# Patient Record
Sex: Female | Born: 2005
Health system: Southern US, Community
[De-identification: ages and names within clinical notes are randomized; demographics above are authoritative.]

---
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
## 2023-02-08 ENCOUNTER — Other Ambulatory Visit: Payer: Self-pay

## 2023-02-08 ENCOUNTER — Encounter: Payer: Self-pay | Admitting: Emergency Medicine

## 2023-02-08 ENCOUNTER — Emergency Department
Admission: EM | Admit: 2023-02-08 | Discharge: 2023-02-08 | Disposition: A | Discharge status: Home / Self Care | Payer: MEDICAID | Attending: Emergency Medicine | Admitting: Emergency Medicine

## 2023-02-08 ENCOUNTER — Emergency Department: Payer: MEDICAID

## 2023-02-08 DIAGNOSIS — F419 Anxiety disorder, unspecified: Secondary | ICD-10-CM | POA: Insufficient documentation

## 2023-02-08 DIAGNOSIS — E860 Dehydration: Secondary | ICD-10-CM | POA: Diagnosis not present

## 2023-02-08 DIAGNOSIS — R0602 Shortness of breath: Secondary | ICD-10-CM | POA: Insufficient documentation

## 2023-02-08 DIAGNOSIS — R0789 Other chest pain: Secondary | ICD-10-CM | POA: Diagnosis not present

## 2023-02-08 DIAGNOSIS — E876 Hypokalemia: Secondary | ICD-10-CM | POA: Diagnosis not present

## 2023-02-08 DIAGNOSIS — T43215A Adverse effect of selective serotonin and norepinephrine reuptake inhibitors, initial encounter: Secondary | ICD-10-CM | POA: Insufficient documentation

## 2023-02-08 DIAGNOSIS — R531 Weakness: Secondary | ICD-10-CM | POA: Insufficient documentation

## 2023-02-08 DIAGNOSIS — T50905A Adverse effect of unspecified drugs, medicaments and biological substances, initial encounter: Secondary | ICD-10-CM

## 2023-02-08 LAB — COMPREHENSIVE METABOLIC PANEL
ALT: 14 U/L (ref 0–44)
AST: 17 U/L (ref 15–41)
Albumin: 4.9 g/dL (ref 3.5–5.0)
Alkaline Phosphatase: 52 U/L (ref 38–126)
Anion gap: 12 (ref 5–15)
BUN: 8 mg/dL (ref 6–20)
CO2: 21 mmol/L — ABNORMAL LOW (ref 22–32)
Calcium: 9.8 mg/dL (ref 8.9–10.3)
Chloride: 102 mmol/L (ref 98–111)
Creatinine, Ser: 0.65 mg/dL (ref 0.44–1.00)
GFR, Estimated: >60 mL/min (ref >60)
Glucose, Bld: 103 mg/dL — ABNORMAL HIGH (ref 70–99)
Potassium: 3 mmol/L — ABNORMAL LOW (ref 3.5–5.1)
Sodium: 135 mmol/L (ref 135–145)
Total Bilirubin: 1.5 mg/dL — ABNORMAL HIGH (ref <1.2)
Total Protein: 7.7 g/dL (ref 6.5–8.1)

## 2023-02-08 LAB — URINALYSIS, ROUTINE W REFLEX MICROSCOPIC
Bilirubin Urine: NEGATIVE
Glucose, UA: NEGATIVE mg/dL
Hgb urine dipstick: NEGATIVE
Ketones, ur: 20 mg/dL — AB
Leukocytes,Ua: NEGATIVE
Nitrite: NEGATIVE
Protein, ur: NEGATIVE mg/dL
Specific Gravity, Urine: 1.002 — ABNORMAL LOW (ref 1.005–1.030)
pH: 7 (ref 5.0–8.0)

## 2023-02-08 LAB — TROPONIN I (HIGH SENSITIVITY): Troponin I (High Sensitivity): 2 ng/L (ref <18)

## 2023-02-08 LAB — LIPASE, BLOOD: Lipase: 29 U/L (ref 11–51)

## 2023-02-08 LAB — PREGNANCY, URINE: Preg Test, Ur: NEGATIVE

## 2023-02-08 MED ORDER — KETOROLAC TROMETHAMINE 30 MG/ML IJ SOLN
15.0000 mg | Freq: Once | INTRAMUSCULAR | Status: AC
  Administered 2023-02-08: 15 mg via INTRAVENOUS
  Filled 2023-02-08: qty 1

## 2023-02-08 MED ORDER — POTASSIUM CHLORIDE CRYS ER 20 MEQ PO TBCR
40.0000 meq | EXTENDED_RELEASE_TABLET | Freq: Once | ORAL | Status: AC
  Administered 2023-02-08: 40 meq via ORAL
  Filled 2023-02-08: qty 2

## 2023-02-08 MED ORDER — LORAZEPAM 2 MG/ML IJ SOLN
1.0000 mg | Freq: Once | INTRAMUSCULAR | Status: AC
  Administered 2023-02-08: 1 mg via INTRAVENOUS
  Filled 2023-02-08: qty 1

## 2023-02-08 MED ORDER — LORAZEPAM 2 MG/ML IJ SOLN: 0.5000 mg | Freq: Once | INTRAMUSCULAR | Status: DC

## 2023-02-08 MED ORDER — ONDANSETRON HCL 4 MG/2ML IJ SOLN
4.0000 mg | Freq: Once | INTRAMUSCULAR | Status: AC
  Administered 2023-02-08: 4 mg via INTRAVENOUS
  Filled 2023-02-08: qty 2

## 2023-02-08 MED ORDER — SODIUM CHLORIDE 0.9 % IV BOLUS
500.0000 mL | Freq: Once | INTRAVENOUS | Status: AC
  Administered 2023-02-08: 500 mL via INTRAVENOUS

## 2023-02-08 NOTE — ED Triage Notes (Signed)
Patient ambulatory to triage with steady gait, without difficulty or distress noted; pt reports dizziness, weakness and SHOB since last night; st has had issues with nausea and subsequent decreased PO intake so unsure if that contributed to her symptoms or if it was due to restarting duloxetine; also reports having migraine

## 2023-02-08 NOTE — Discharge Instructions (Signed)
Restart Duloxetine at 1/2 dose and titrate up over the next 3 to 5 days.  Drink plenty of fluids daily.  Return to the ER for worsening symptoms, persistent vomiting, difficulty breathing or other concerns.

## 2023-02-08 NOTE — ED Provider Notes (Signed)
[facility] Provider Note    Event Date/Time   First MD Initiated Contact with Patient 02/08/23 [phone]     (approximate)   History   Shortness of Breath   HPI  Erica Nelson is a 17 y.o. female who presents to the ED from home with multiple medical complaints.  Patient restarted duloxetine yesterday.  She had been placed on it in February but taking herself off because she was able to stabilize her mood swings.  She has been experiencing labile mood recently so restarted it yesterday.  Since restarting duloxetine, patient reports dizziness/lightheadedness, generalized weakness, chest tightness, shortness of breath and nausea.  Endorses heavy marijuana use, trying to taper down.  Also having a migraine.  Denies fever/chills, abdominal pain, vomiting or diarrhea.  Denies active SI/HI/AH/VH.  Denies OCP use.     Past Medical History  History reviewed. No pertinent past medical history.   Active Problem List  There are no problems to display for this patient.    Past Surgical History  History reviewed. No pertinent surgical history.   Home Medications   Prior to Admission medications   Not on File     Allergies  Patient has no known allergies.   Family History  History reviewed. No pertinent family history.   Physical Exam  Triage Vital Signs: ED Triage Vitals  Encounter Vitals Group     BP 02/08/23 0351 (!) 140/96     Systolic BP Percentile --      Diastolic BP Percentile --      Pulse Rate 02/08/23 0351 93     Resp 02/08/23 0351 18     Temp 02/08/23 0351 98.5 F (36.9 C)     Temp Source 02/08/23 0351 Oral     SpO2 02/08/23 0351 98 %     Weight 02/08/23 0343 133 lb (60.3 kg)     Height 02/08/23 0343 5\' 11"  (1.803 m)     Head Circumference --      Peak Flow --      Pain Score 02/08/23 0343 6     Pain Loc --      Pain Education --      Exclude from Growth Chart --     Updated Vital Signs: BP (!) 140/96 (BP Location: Left  Arm)   Pulse 93   Temp 98.5 F (36.9 C) (Oral)   Resp 18   Ht 5\' 11"  (1.803 m)   Wt 60.3 kg   LMP 01/22/2023 (Exact Date)   SpO2 98%   BMI 18.55 kg/m    General: Awake, no distress.  Mildly anxious.  Rapid speech. CV:  RRR.  Good peripheral perfusion.  Resp:  Normal effort.  CTAB. Abd:  Nontender.  No distention.  Other:  PERRL.  EOMI.  Supple neck without meningismus.   ED Results / Procedures / Treatments  Labs (all labs ordered are listed, but only abnormal results are displayed) Labs Reviewed  URINALYSIS, ROUTINE W REFLEX MICROSCOPIC - Abnormal; Notable for the following components:      Result Value   Color, Urine STRAW (*)    APPearance CLEAR (*)    Specific Gravity, Urine 1.002 (*)    Ketones, ur 20 (*)    All other components within normal limits  COMPREHENSIVE METABOLIC PANEL - Abnormal; Notable for the following components:   Potassium 3.0 (*)    CO2 21 (*)    Glucose, Bld 103 (*)    Total Bilirubin 1.5 (*)  All other components within normal limits  LIPASE, BLOOD  CBC WITH DIFFERENTIAL/PLATELET  TSH  T4, FREE  PREGNANCY, URINE  TROPONIN I (HIGH SENSITIVITY)     EKG  ED ECG REPORT I, Maryann Mccall J, the attending physician, personally viewed and interpreted this ECG.   Date: 02/08/2023  EKG Time: 0355  Rate: 110  Rhythm: sinus tachycardia  Axis: Normal  Intervals:none  ST&T Change: Nonspecific    RADIOLOGY I have independently visualized and interpreted patient's imaging study as well as noted the radiology interpretation:  X-ray: No acute cardiopulmonary process  Official radiology report(s): DG Chest 2 View  Result Date: 02/08/2023 CLINICAL DATA:  Shortness of breath. EXAM: CHEST - 2 VIEW COMPARISON:  10/25/2004 FINDINGS: The heart size and mediastinal contours are within normal limits. Both lungs are clear. The visualized skeletal structures are unremarkable. IMPRESSION: No active cardiopulmonary disease. Electronically Signed   By:  Kennith Center M.D.   On: 02/08/2023 05:00     PROCEDURES:  Critical Care performed: No  Procedures   MEDICATIONS ORDERED IN ED: Medications  sodium chloride 0.9 % bolus 500 mL (0 mLs Intravenous Stopped 02/08/23 0534)  ondansetron (ZOFRAN) injection 4 mg (4 mg Intravenous Given 02/08/23 0431)  ketorolac (TORADOL) 30 MG/ML injection 15 mg (15 mg Intravenous Given 02/08/23 0431)  LORazepam (ATIVAN) injection 1 mg (1 mg Intravenous Given 02/08/23 0441)  potassium chloride SA (KLOR-CON M) CR tablet 40 mEq (40 mEq Oral Given 02/08/23 0541)     IMPRESSION / MDM / ASSESSMENT AND PLAN / ED COURSE  I reviewed the triage vital signs and the nursing notes.                             17 year old female presenting with multiple medical complaints. Differential includes, but is not limited to, viral syndrome, bronchitis including COPD exacerbation, pneumonia, reactive airway disease including asthma, CHF including exacerbation with or without pulmonary/interstitial edema, pneumothorax, ACS, thoracic trauma, and pulmonary embolism. I have personally reviewed patient's records and note and immunization encounter from June 2021.  Patient's presentation is most consistent with acute illness / injury with system symptoms.  Will obtain cardiac panel, check thyroid function, chest x-ray.  Administer Ketorolac and Zofran for migraine, add IV Ativan for anxiety.  Will reassess.  Clinical Course as of 02/08/23 4540  Tue Feb 08, 2023  9811 Patient feeling better.  Laboratory results remarkable for mild hypokalemia and ketonuria.  Advised patient to restart duloxetine at one half dose and titrate up over the course of a week.  Strict return precautions given.  Patient and family member verbalized understanding and agree with plan of care. [JS]    Clinical Course User Index [JS] Irean Hong, MD     FINAL CLINICAL IMPRESSION(S) / ED DIAGNOSES   Final diagnoses:  Shortness of breath  Hypokalemia  Adverse  effect of drug, initial encounter  Dehydration     Rx / DC Orders   ED Discharge Orders     None        Note:  This document was prepared using Dragon voice recognition software and may include unintentional dictation errors.   Irean Hong, MD 02/08/23 [phone]

## 2023-07-26 NOTE — Progress Notes (Unsigned)
    Erica Nelson D.[facility] [address] [place] [address] Phone: [phone]   Assessment and Plan:     There are no diagnoses linked to this encounter.  ***   Pertinent previous records reviewed include ***    Follow Up: ***     Subjective:   I, Erica Nelson, am serving as a Neurosurgeon for Doctor Erica Nelson  Chief Complaint: joint pain  HPI:   07/27/2023 Patient is a 18 year old female with joint pain. Patient states   Relevant Historical Information: ***  Additional pertinent review of systems negative.  No current outpatient medications on file.   Objective:     There were no vitals filed for this visit.    There is no height or weight on file to calculate BMI.    Physical Exam:    ***   Electronically signed by:  Erica Nelson D.[facility] 7:45 AM 07/26/23

## 2023-07-27 ENCOUNTER — Ambulatory Visit (INDEPENDENT_AMBULATORY_CARE_PROVIDER_SITE_OTHER): Payer: MEDICAID

## 2023-07-27 ENCOUNTER — Ambulatory Visit (INDEPENDENT_AMBULATORY_CARE_PROVIDER_SITE_OTHER): Payer: MEDICAID | Admitting: Sports Medicine

## 2023-07-27 VITALS — BP 120/80 | HR 76 | Ht 72.0 in | Wt 127.0 lb

## 2023-07-27 DIAGNOSIS — M357 Hypermobility syndrome: Secondary | ICD-10-CM

## 2023-07-27 DIAGNOSIS — M2550 Pain in unspecified joint: Secondary | ICD-10-CM

## 2023-07-27 DIAGNOSIS — M25552 Pain in left hip: Secondary | ICD-10-CM

## 2023-07-27 DIAGNOSIS — M25571 Pain in right ankle and joints of right foot: Secondary | ICD-10-CM | POA: Diagnosis not present

## 2023-07-27 DIAGNOSIS — G8929 Other chronic pain: Secondary | ICD-10-CM | POA: Diagnosis not present

## 2023-07-27 DIAGNOSIS — M25562 Pain in left knee: Secondary | ICD-10-CM

## 2023-07-27 DIAGNOSIS — M25551 Pain in right hip: Secondary | ICD-10-CM

## 2023-07-27 DIAGNOSIS — M25572 Pain in left ankle and joints of left foot: Secondary | ICD-10-CM

## 2023-07-27 DIAGNOSIS — M25522 Pain in left elbow: Secondary | ICD-10-CM | POA: Diagnosis not present

## 2023-07-27 DIAGNOSIS — M25521 Pain in right elbow: Secondary | ICD-10-CM

## 2023-07-27 DIAGNOSIS — M25561 Pain in right knee: Secondary | ICD-10-CM

## 2023-07-27 LAB — CBC WITH DIFFERENTIAL/PLATELET
Basophils Absolute: 0 10*3/uL (ref 0.0–0.1)
Basophils Relative: 0.4 % (ref 0.0–3.0)
Eosinophils Absolute: 0.1 10*3/uL (ref 0.0–0.7)
Eosinophils Relative: 0.6 % (ref 0.0–5.0)
HCT: 42.6 % (ref 36.0–49.0)
Hemoglobin: 14.3 g/dL (ref 12.0–16.0)
Lymphocytes Relative: 18.2 % — ABNORMAL LOW (ref 24.0–48.0)
Lymphs Abs: 1.7 10*3/uL (ref 0.7–4.0)
MCHC: 33.5 g/dL (ref 31.0–37.0)
MCV: 90.8 fl (ref 78.0–98.0)
Monocytes Absolute: 0.5 10*3/uL (ref 0.1–1.0)
Monocytes Relative: 5 % (ref 3.0–12.0)
Neutro Abs: 7 10*3/uL (ref 1.4–7.7)
Neutrophils Relative %: 75.8 % — ABNORMAL HIGH (ref 43.0–71.0)
Platelets: 185 10*3/uL (ref 150.0–575.0)
RBC: 4.69 Mil/uL (ref 3.80–5.70)
RDW: 12.4 % (ref 11.4–15.5)
WBC: 9.2 10*3/uL (ref 4.5–13.5)

## 2023-07-27 LAB — C-REACTIVE PROTEIN: CRP: <1 mg/dL (ref 0.5–20.0)

## 2023-07-27 LAB — VITAMIN D 25 HYDROXY (VIT D DEFICIENCY, FRACTURES): VITD: 19.16 ng/mL — ABNORMAL LOW (ref 30.00–100.00)

## 2023-07-27 LAB — COMPREHENSIVE METABOLIC PANEL WITH GFR
ALT: 15 U/L (ref 0–35)
AST: 16 U/L (ref 0–37)
Albumin: 4.6 g/dL (ref 3.5–5.2)
Alkaline Phosphatase: 41 U/L — ABNORMAL LOW (ref 47–119)
BUN: 13 mg/dL (ref 6–23)
CO2: 25 meq/L (ref 19–32)
Calcium: 9.7 mg/dL (ref 8.4–10.5)
Chloride: 105 meq/L (ref 96–112)
Creatinine, Ser: 0.66 mg/dL (ref 0.40–1.20)
GFR: 127.15 mL/min (ref >60.00)
Glucose, Bld: 94 mg/dL (ref 70–99)
Potassium: 4.3 meq/L (ref 3.5–5.1)
Sodium: 136 meq/L (ref 135–145)
Total Bilirubin: 0.4 mg/dL (ref 0.2–1.2)
Total Protein: 7.7 g/dL (ref 6.0–8.3)

## 2023-07-27 LAB — FERRITIN: Ferritin: 16.7 ng/mL (ref 10.0–291.0)

## 2023-07-27 LAB — TSH: TSH: 1.35 u[IU]/mL (ref 0.40–5.00)

## 2023-07-27 LAB — URIC ACID: Uric Acid, Serum: 3.3 mg/dL (ref 2.4–7.0)

## 2023-07-27 LAB — SEDIMENTATION RATE: Sed Rate: 5 mm/h (ref 0–20)

## 2023-07-27 NOTE — Patient Instructions (Addendum)
 Elbow , hips, knee , ankle HEP  Xrays on the way out  PT referral  Labs on the way out  4 week follow up

## 2023-07-28 ENCOUNTER — Encounter: Payer: Self-pay | Admitting: Sports Medicine

## 2023-07-30 LAB — RHEUMATOID FACTOR: Rheumatoid fact SerPl-aCnc: <10 [IU]/mL (ref <14)

## 2023-07-30 LAB — CYCLIC CITRUL PEPTIDE ANTIBODY, IGG: Cyclic Citrullin Peptide Ab: <16 U

## 2023-07-31 LAB — ANA+ENA+DNA/DS+SCL 70+SJOSSA/B
ANA Titer 1: NEGATIVE
ENA RNP Ab: <0.2 AI (ref 0.0–0.9)
ENA SM Ab Ser-aCnc: <0.2 AI (ref 0.0–0.9)
ENA SSA (RO) Ab: <0.2 AI (ref 0.0–0.9)
ENA SSB (LA) Ab: <0.2 AI (ref 0.0–0.9)
Scleroderma (Scl-70) (ENA) Antibody, IgG: <0.2 AI (ref 0.0–0.9)
dsDNA Ab: <1 [IU]/mL (ref 0–9)

## 2023-08-01 ENCOUNTER — Encounter: Payer: Self-pay | Admitting: Sports Medicine

## 2023-08-23 NOTE — Progress Notes (Signed)
 Erica Nelson D.[facility] [address] [place] [address] Phone: [phone]   Assessment and Plan:     1. Hypermobility syndrome 2. Polyarthralgia 3. Neck pain 4. Chronic bilateral thoracic back pain 5. Somatic dysfunction of cervical region 6. Somatic dysfunction of thoracic region 7. Somatic dysfunction of lumbar region 8. Somatic dysfunction of pelvic region 9. Somatic dysfunction of rib region -Chronic with exacerbation, subsequent visit - Overall mild improvement in symptoms since starting HEP primarily for knees and elbows - Patient's intermittent flares of chronic pain is still most likely related to hypermobility syndrome leading to joint instability and musculoskeletal pain. - Reviewed patient's x-rays, lab work which were unremarkable and negative for autoimmune conditions - Recommend continuing a conservative treatment plan with goals of improving strength and stability of joints to decrease chronic pain.  Continue HEP and start physical therapy.  Patient plans on reaching out to physical therapy to initiate care - Recommend wearing comfortable and supportive shoes especially during summer job at [employer] to decrease chronic pain - Recommend Tylenol for day-to-day pain relief and may use meloxicam 15 mg daily as needed for breakthrough pain.  Recommend limiting chronic NSAIDs to 1-2 doses per week.  Prescription provided today - Patient elected for initial OMT today.  Tolerated well per note below. - Decision today to treat with OMT was based on Physical Exam  After verbal consent patient was treated with HVLA (high velocity low amplitude), ME (muscle energy), FPR (flex positional release), ST (soft tissue), PC/PD (Pelvic Compression/ Pelvic Decompression) techniques in cervical, rib, thoracic, lumbar, and pelvic areas. Patient tolerated the procedure well with improvement in symptoms.  Patient educated on potential side effects of  soreness and recommended to rest, hydrate, and use Tylenol as needed for pain control.    Patient was accompanied by her mother throughout entirety of office visit  Pertinent previous records reviewed include lab work from 07/24/2023, bilateral elbow and bilateral knee and bilateral hip x-rays from 07/27/2023  Follow Up: 4 weeks for reevaluation.  Could consider repeat OMT   Subjective:   I, Erica Nelson, am serving as a Neurosurgeon for Doctor Ulysees Gander   Chief Complaint: joint pain   HPI:    07/27/2023 Patient is a 18 year old female with joint pain. Patient states ankle and knee soreness. Has had that for years.  Bilat ankle pain general problem with walking, uses a cane for support and balance has a hard time standing feels like she is falling or going to fall. Some days she just wakes up hurting due to pain. Does get some radiating pain  into the calves due to pain in ankle and knee pain. Has pain when working and or driving for distances of time. Intermittent ibu and tylenol but doesn't use often. No numbness but sometimes rare tingling. She describes the pain as general soreness that sometimes goes to the hips as well    08/24/2023 Patient states she is a little better. HEP has helped with morning pain   Relevant Historical Information: ADHD Additional pertinent review of systems negative.   Current Outpatient Medications:    escitalopram (LEXAPRO) 10 MG tablet, Take by mouth., Disp: , Rfl:    levocetirizine (XYZAL) 5 MG tablet, SMARTSIG:1 Tablet(s) By Mouth Every Evening, Disp: , Rfl:    LORYNA 3-0.02 MG tablet, Take 1 tablet by mouth daily., Disp: , Rfl:    meloxicam (MOBIC) 15 MG tablet, Take 1 tablet (15 mg total) by mouth  as needed for pain., Disp: 30 tablet, Rfl: 0   montelukast (SINGULAIR) 10 MG tablet, Take 10 mg by mouth daily., Disp: , Rfl:    TRI-SPRINTEC 0.18/0.215/0.25 MG-35 MCG tablet, Take 1 tablet by mouth daily., Disp: , Rfl:    Objective:     Vitals:    08/24/23 0955  BP: 120/80  Weight: 126 lb (57.2 kg)  Height: 6' (1.829 m)      Body mass index is 17.09 kg/m.    Physical Exam:     General: Well-appearing, cooperative, sitting comfortably in no acute distress.   OMT Physical Exam:  ASIS Compression Test: Positive Right Cervical: TTP paraspinal, C3 RRSL, C6 RLSR Rib: Bilateral elevated first rib with TTP, worse on right Thoracic: TTP paraspinal, T3-6 RRSL Lumbar: TTP paraspinal, L2 RLSL Pelvis: Right anterior innominate with out flare   Electronically signed by:  Marshall Skeeter D.[facility] 11:24 AM 08/24/23

## 2023-08-24 ENCOUNTER — Ambulatory Visit (INDEPENDENT_AMBULATORY_CARE_PROVIDER_SITE_OTHER): Payer: MEDICAID | Admitting: Sports Medicine

## 2023-08-24 VITALS — BP 120/80 | Ht 72.0 in | Wt 126.0 lb

## 2023-08-24 DIAGNOSIS — M546 Pain in thoracic spine: Secondary | ICD-10-CM

## 2023-08-24 DIAGNOSIS — M357 Hypermobility syndrome: Secondary | ICD-10-CM | POA: Diagnosis not present

## 2023-08-24 DIAGNOSIS — M542 Cervicalgia: Secondary | ICD-10-CM | POA: Diagnosis not present

## 2023-08-24 DIAGNOSIS — G8929 Other chronic pain: Secondary | ICD-10-CM

## 2023-08-24 DIAGNOSIS — M2550 Pain in unspecified joint: Secondary | ICD-10-CM

## 2023-08-24 DIAGNOSIS — M9908 Segmental and somatic dysfunction of rib cage: Secondary | ICD-10-CM

## 2023-08-24 DIAGNOSIS — M9901 Segmental and somatic dysfunction of cervical region: Secondary | ICD-10-CM

## 2023-08-24 DIAGNOSIS — M9905 Segmental and somatic dysfunction of pelvic region: Secondary | ICD-10-CM

## 2023-08-24 DIAGNOSIS — M9903 Segmental and somatic dysfunction of lumbar region: Secondary | ICD-10-CM

## 2023-08-24 DIAGNOSIS — M9902 Segmental and somatic dysfunction of thoracic region: Secondary | ICD-10-CM

## 2023-08-24 MED ORDER — MELOXICAM 15 MG PO TABS: 15.0000 mg | ORAL_TABLET | ORAL | 0 refills | Status: DC | PRN

## 2023-08-24 NOTE — Patient Instructions (Signed)
 Tylenol [phone] mg 2-3 times a day for pain relief  Meloxicam as needed for breakthrough pain limit to 1-2 times per week  4 week follow up

## 2023-09-19 NOTE — Progress Notes (Unsigned)
    Ben Jackson [name] [address] [place] [address] Phone: [phone]   Assessment and Plan:     There are no diagnoses linked to this encounter.  ***   Pertinent previous records reviewed include ***    Follow Up: ***     Subjective:    Chief Complaint: ***  HPI:  07/27/2023 Patient is a 18 year old female with joint pain. Patient states ankle and knee soreness. Has had that for years.  Bilat ankle pain general problem with walking, uses a cane for support and balance has a hard time standing feels like she is falling or going to fall. Some days she just wakes up hurting due to pain. Does get some radiating pain  into the calves due to pain in ankle and knee pain. Has pain when working and or driving for distances of time. Intermittent ibu and tylenol but doesn't use often. No numbness but sometimes rare tingling. She describes the pain as general soreness that sometimes goes to the hips as well    08/24/2023 Patient states she is a little better. HEP has helped with morning pain  09/21/23 Patient states   Relevant Historical Information: ADHD  Additional pertinent review of systems negative.   Current Outpatient Medications:    escitalopram (LEXAPRO) 10 MG tablet, Take by mouth., Disp: , Rfl:    levocetirizine (XYZAL) 5 MG tablet, SMARTSIG:1 Tablet(s) By Mouth Every Evening, Disp: , Rfl:    LORYNA 3-0.02 MG tablet, Take 1 tablet by mouth daily., Disp: , Rfl:    meloxicam  (MOBIC ) 15 MG tablet, Take 1 tablet (15 mg total) by mouth as needed for pain., Disp: 30 tablet, Rfl: 0   montelukast (SINGULAIR) 10 MG tablet, Take 10 mg by mouth daily., Disp: , Rfl:    TRI-SPRINTEC 0.18/0.215/0.25 MG-35 MCG tablet, Take 1 tablet by mouth daily., Disp: , Rfl:    Objective:     There were no vitals filed for this visit.    There is no height or weight on file to calculate BMI.    Physical Exam:    ***   Electronically signed  by:  Marshall Skeeter [name] 9:35 AM 09/19/23

## 2023-09-21 ENCOUNTER — Ambulatory Visit: Payer: MEDICAID | Admitting: Sports Medicine

## 2023-09-21 VITALS — BP 102/60 | HR 90 | Ht 72.0 in | Wt 130.6 lb

## 2023-09-21 DIAGNOSIS — M357 Hypermobility syndrome: Secondary | ICD-10-CM

## 2023-09-21 DIAGNOSIS — M546 Pain in thoracic spine: Secondary | ICD-10-CM

## 2023-09-21 DIAGNOSIS — M9905 Segmental and somatic dysfunction of pelvic region: Secondary | ICD-10-CM

## 2023-09-21 DIAGNOSIS — G8929 Other chronic pain: Secondary | ICD-10-CM

## 2023-09-21 DIAGNOSIS — M2550 Pain in unspecified joint: Secondary | ICD-10-CM

## 2023-09-21 DIAGNOSIS — M542 Cervicalgia: Secondary | ICD-10-CM | POA: Diagnosis not present

## 2023-09-21 DIAGNOSIS — M9908 Segmental and somatic dysfunction of rib cage: Secondary | ICD-10-CM

## 2023-09-21 DIAGNOSIS — M9901 Segmental and somatic dysfunction of cervical region: Secondary | ICD-10-CM

## 2023-09-21 DIAGNOSIS — M9902 Segmental and somatic dysfunction of thoracic region: Secondary | ICD-10-CM

## 2023-09-21 DIAGNOSIS — M9903 Segmental and somatic dysfunction of lumbar region: Secondary | ICD-10-CM

## 2023-09-28 ENCOUNTER — Other Ambulatory Visit: Payer: Self-pay | Admitting: Sports Medicine

## 2023-10-25 ENCOUNTER — Ambulatory Visit: Payer: MEDICAID | Admitting: Sports Medicine

## 2023-10-28 NOTE — Progress Notes (Signed)
 Ben Rayquan Amrhein D.[facility] [address] [place] [address] Phone: [phone]   Assessment and Plan:     1. Hypermobility syndrome 2. Polyarthralgia 3. Somatic dysfunction of cervical region 4. Somatic dysfunction of thoracic region 5. Somatic dysfunction of lumbar region 6. Somatic dysfunction of pelvic region 7. Somatic dysfunction of rib region  -Chronic with exacerbation, subsequent visit - Overall improvement in musculoskeletal symptoms with OMT, HEP, as needed NSAIDs.  Recurrent flare of upper back and lower extremity pain without specific MOI.  Still consistent with hypermobility leading to polyarthralgia and musculoskeletal dysfunction - Use Tylenol 500 to 1000 mg tablets 2-3 times a day for day-to-day pain relief - Use meloxicam  15 mg daily as needed for pain.  Recommend limiting chronic NSAIDs to 1-2 doses per week to prevent long-term side effects.  Refill provided - Patient has received relief with OMT in the past.  Elects for repeat OMT today.  Tolerated well per note below. - Decision today to treat with OMT was based on Physical Exam  After verbal consent patient was treated with HVLA (high velocity low amplitude), ME (muscle energy), FPR (flex positional release), ST (soft tissue), PC/PD (Pelvic Compression/ Pelvic Decompression) techniques in cervical, rib, thoracic, lumbar, and pelvic areas. Patient tolerated the procedure well with improvement in symptoms.  Patient educated on potential side effects of soreness and recommended to rest, hydrate, and use Tylenol as needed for pain control.   Pertinent previous records reviewed include none  Follow Up: 4 weeks for reevaluation.  Could consider repeat OMT   Subjective:   I, [name], am serving as a Neurosurgeon for Doctor Morene Mace  Chief Complaint: joint pain    HPI:  07/27/2023 Patient is a 18 year old female with joint pain. Patient states ankle and knee  soreness. Has had that for years.  Bilat ankle pain general problem with walking, uses a cane for support and balance has a hard time standing feels like she is falling or going to fall. Some days she just wakes up hurting due to pain. Does get some radiating pain  into the calves due to pain in ankle and knee pain. Has pain when working and or driving for distances of time. Intermittent ibu and tylenol but doesn't use often. No numbness but sometimes rare tingling. She describes the pain as general soreness that sometimes goes to the hips as well    08/24/2023 Patient states she is a little better. HEP has helped with morning pain   09/21/23 Patient states pain wise she is doing ok. Had a bad day yesterday but took the meloxicam  and it got better. Just a little sore today.   10/31/2023 Patient states she is alright has some soreness has been working a lot    Relevant Historical Information: ADHD  Additional pertinent review of systems negative.   Current Outpatient Medications:    escitalopram (LEXAPRO) 10 MG tablet, Take by mouth., Disp: , Rfl:    levocetirizine (XYZAL) 5 MG tablet, SMARTSIG:1 Tablet(s) By Mouth Every Evening, Disp: , Rfl:    LORYNA 3-0.02 MG tablet, Take 1 tablet by mouth daily., Disp: , Rfl:    meloxicam  (MOBIC ) 15 MG tablet, Take 1 tablet (15 mg total) by mouth as needed for pain., Disp: 30 tablet, Rfl: 0   montelukast (SINGULAIR) 10 MG tablet, Take 10 mg by mouth daily., Disp: , Rfl:    TRI-SPRINTEC 0.18/0.215/0.25 MG-35 MCG tablet, Take 1 tablet by mouth daily., Disp: ,  Rfl:    Objective:     Vitals:   10/31/23 1438  Pulse: 88  SpO2: 99%  Weight: 128 lb (58.1 kg)  Height: 6' (1.829 m)      Body mass index is 17.36 kg/m.    Physical Exam:    General: Well-appearing, cooperative, sitting comfortably in no acute distress.   OMT Physical Exam:  ASIS Compression Test: Positive Right Cervical: TTP paraspinal, C3-5 RRSR, C6 RLSR Rib: Bilateral elevated  first rib with TTP Thoracic: TTP paraspinal, T4-6 RRSL, T8-11 RLSR Lumbar: TTP paraspinal, L1-3 RRSL, L5 RL Pelvis: Right anterior innominate    Electronically signed by:  Odis Mace D.[facility] 4:11 PM 10/31/23

## 2023-10-31 ENCOUNTER — Ambulatory Visit (INDEPENDENT_AMBULATORY_CARE_PROVIDER_SITE_OTHER): Payer: MEDICAID | Admitting: Sports Medicine

## 2023-10-31 VITALS — HR 88 | Ht 72.0 in | Wt 128.0 lb

## 2023-10-31 DIAGNOSIS — M9908 Segmental and somatic dysfunction of rib cage: Secondary | ICD-10-CM

## 2023-10-31 DIAGNOSIS — M2550 Pain in unspecified joint: Secondary | ICD-10-CM

## 2023-10-31 DIAGNOSIS — M9902 Segmental and somatic dysfunction of thoracic region: Secondary | ICD-10-CM | POA: Diagnosis not present

## 2023-10-31 DIAGNOSIS — M9903 Segmental and somatic dysfunction of lumbar region: Secondary | ICD-10-CM

## 2023-10-31 DIAGNOSIS — M9901 Segmental and somatic dysfunction of cervical region: Secondary | ICD-10-CM | POA: Diagnosis not present

## 2023-10-31 DIAGNOSIS — M9905 Segmental and somatic dysfunction of pelvic region: Secondary | ICD-10-CM

## 2023-10-31 DIAGNOSIS — M357 Hypermobility syndrome: Secondary | ICD-10-CM | POA: Diagnosis not present

## 2023-10-31 NOTE — Patient Instructions (Signed)
-   Use meloxicam  15 mg daily as needed for pain.  Recommend limiting chronic NSAIDs to 1-2 doses per week to prevent long-term side effects.  4 week follow up

## 2023-11-24 NOTE — Progress Notes (Signed)
 Erica Nelson D.Erica Nelson [facility] [address] [place] [address] Phone: [phone]   Assessment and Plan:     1. Hypermobility syndrome (Primary) 2. Polyarthralgia 3. Neck pain 4. Chronic bilateral thoracic back pain 5. Somatic dysfunction of cervical region 6. Somatic dysfunction of thoracic region 7. Somatic dysfunction of lumbar region 8. Somatic dysfunction of rib region 9. Somatic dysfunction of pelvic region -Chronic with exacerbation, subsequent visit - Overall patient is feeling improvements with HEP, intermittent meloxicam  use, OMT.  Continued pains primarily in bilateral shoulders, knees, low back today - Use meloxicam  15 mg daily as needed for pain.  Recommend limiting chronic NSAIDs to 1-2 doses per week to prevent long-term side effects. - Repeat referral sent for physical therapy - Patient mentioned that her PCP thought rheumatology referral could be beneficial.  The autoimmune lab work that we performed showed negative ANA, normal antibody titers, inflammatory markers all within normal limits, so unless there is believed to be a seronegative arthropathy, I do not see a benefit to rheumatology referral at this time. - Patient has received relief with OMT in the past.  Elects for repeat OMT today.  Tolerated well per note below. - Decision today to treat with OMT was based on Physical Exam   After verbal consent patient was treated with HVLA (high velocity low amplitude), ME (muscle energy), FPR (flex positional release), ST (soft tissue), PC/PD (Pelvic Compression/ Pelvic Decompression) techniques in cervical, rib, thoracic, lumbar, and pelvic areas. Patient tolerated the procedure well with improvement in symptoms.  Patient educated on potential side effects of soreness and recommended to rest, hydrate, and use Tylenol as needed for pain control.   Pertinent previous records reviewed include none  Follow Up: 4 weeks for reevaluation.   Could consider repeat OMT   Subjective:   I, Erica Nelson, am serving as a Neurosurgeon for Doctor Morene Mace  Chief Complaint: OMT  HPI:  07/27/2023 Patient is a 18 year old female with joint pain. Patient states ankle and knee soreness. Has had that for years.  Bilat ankle pain general problem with walking, uses a cane for support and balance has a hard time standing feels like she is falling or going to fall. Some days she just wakes up hurting due to pain. Does get some radiating pain  into the calves due to pain in ankle and knee pain. Has pain when working and or driving for distances of time. Intermittent ibu and tylenol but doesn't use often. No numbness but sometimes rare tingling. She describes the pain as general soreness that sometimes goes to the hips as well    08/24/2023 Patient states she is a little better. HEP has helped with morning pain   09/21/23 Patient states pain wise she is doing ok. Had a bad day yesterday but took the meloxicam  and it got better. Just a little sore today.    10/31/2023 Patient states she is alright has some soreness has been working a lot   11/28/2023 Patient states meloxicam  referral, pcp recommended rheumatology referral. She is a little sore do to increased activity   Relevant Historical Information: ADHD      Additional pertinent review of systems negative.  Current Outpatient Medications  Medication Sig Dispense Refill   escitalopram (LEXAPRO) 10 MG tablet Take by mouth.     levocetirizine (XYZAL) 5 MG tablet SMARTSIG:1 Tablet(s) By Mouth Every Evening     LORYNA 3-0.02 MG tablet Take 1 tablet by mouth daily.  meloxicam  (MOBIC ) 15 MG tablet Take 1 tablet (15 mg total) by mouth as needed for pain. 30 tablet 0   montelukast (SINGULAIR) 10 MG tablet Take 10 mg by mouth daily.     TRI-SPRINTEC 0.18/0.215/0.25 MG-35 MCG tablet Take 1 tablet by mouth daily.     No current facility-administered medications for this visit.       Objective:     Vitals:   11/28/23 1443  Pulse: (!) 116  SpO2: 98%  Weight: 129 lb (58.5 kg)  Height: 6' (1.829 m)      Body mass index is 17.5 kg/m.    Physical Exam:     General: Well-appearing, cooperative, sitting comfortably in no acute distress.   OMT Physical Exam:  ASIS Compression Test: Positive Right Cervical: TTP paraspinal, C4 RRSL Rib: Bilateral elevated first rib with TTP Thoracic: TTP paraspinal, T8-11 RRSL Lumbar: TTP paraspinal, 1-3 RLSR Pelvis: Right anterior innominate  Electronically signed by:  Odis Mace D.Erica [facility] 3:18 PM 11/28/23

## 2023-11-28 ENCOUNTER — Ambulatory Visit (INDEPENDENT_AMBULATORY_CARE_PROVIDER_SITE_OTHER): Payer: MEDICAID | Admitting: Sports Medicine

## 2023-11-28 VITALS — HR 116 | Ht 72.0 in | Wt 129.0 lb

## 2023-11-28 DIAGNOSIS — M9901 Segmental and somatic dysfunction of cervical region: Secondary | ICD-10-CM

## 2023-11-28 DIAGNOSIS — M9903 Segmental and somatic dysfunction of lumbar region: Secondary | ICD-10-CM

## 2023-11-28 DIAGNOSIS — M542 Cervicalgia: Secondary | ICD-10-CM | POA: Diagnosis not present

## 2023-11-28 DIAGNOSIS — M357 Hypermobility syndrome: Secondary | ICD-10-CM

## 2023-11-28 DIAGNOSIS — M546 Pain in thoracic spine: Secondary | ICD-10-CM | POA: Diagnosis not present

## 2023-11-28 DIAGNOSIS — M9905 Segmental and somatic dysfunction of pelvic region: Secondary | ICD-10-CM

## 2023-11-28 DIAGNOSIS — G8929 Other chronic pain: Secondary | ICD-10-CM

## 2023-11-28 DIAGNOSIS — M9902 Segmental and somatic dysfunction of thoracic region: Secondary | ICD-10-CM

## 2023-11-28 DIAGNOSIS — M2550 Pain in unspecified joint: Secondary | ICD-10-CM

## 2023-11-28 DIAGNOSIS — M9908 Segmental and somatic dysfunction of rib cage: Secondary | ICD-10-CM | POA: Diagnosis not present

## 2023-11-28 MED ORDER — MELOXICAM 15 MG PO TABS: 15.0000 mg | ORAL_TABLET | ORAL | 0 refills | Status: DC | PRN

## 2023-11-28 NOTE — Patient Instructions (Signed)
 Thank you for coming in today  We have sent in a refill of meloxicam  15 mg daily as needed for pain relief.  Recommend trying to limit NSAIDs to 1-2 doses per week  We have sent in a referral for physical therapy.  You are welcome to further discuss the rheumatology referral with your primary care physician.  I do not believe a rheumatology referral is necessary at this time based on lab work that was unremarkable including negative ANA, inflammatory markers within normal limits, unremarkable antibody testing.  Follow-up in clinic in 4 weeks for reevaluation.

## 2023-12-23 NOTE — Progress Notes (Signed)
 [facility] [address] [place] [address] Phone: [phone]   Assessment and Plan:     1. Hypermobility syndrome (Primary) 2. Polyarthralgia 3. Neck pain 4. Chronic bilateral thoracic back pain 5. Somatic dysfunction of cervical region 6. Somatic dysfunction of thoracic region 7. Somatic dysfunction of lumbar region 8. Somatic dysfunction of rib region 9. Somatic dysfunction of pelvic region -Chronic with exacerbation, subsequent visit - Continued musculoskeletal pain.  Neck pain and back pain have overall improved with conservative therapy, however patient has had flares of joint pain, specifically bilateral knees and hands since previous office visit. - Patient establish care with a new PCP who is initiating a Marfan workup - Prior autoimmune lab work was unremarkable including negative ANA, negative antibody titers - Use meloxicam  15 mg daily as needed for pain.  Recommend limiting chronic NSAIDs to 1-2 doses per week to prevent long-term side effects. - External referral for physical therapy provided.  Continue HEP - Patient has received relief with OMT in the past.  Elects for repeat OMT today.  Tolerated well per note below. - Decision today to treat with OMT was based on Physical Exam  After verbal consent patient was treated with HVLA (high velocity low amplitude), ME (muscle energy), FPR (flex positional release), ST (soft tissue), PC/PD (Pelvic Compression/ Pelvic Decompression) techniques in cervical, rib, thoracic, lumbar, and pelvic areas. Patient tolerated the procedure well with improvement in symptoms.  Patient educated on potential side effects of soreness and recommended to rest, hydrate, and use Tylenol as needed for pain control.     Pertinent previous records reviewed include none   Follow Up: 4 weeks for reevaluation.  Could consider repeat OMT   Subjective:   I, Erica Nelson, am serving as a  Neurosurgeon for Doctor Morene Mace  Chief Complaint: OMT   HPI:  07/27/2023 Patient is a 18 year old female with joint pain. Patient states ankle and knee soreness. Has had that for years.  Bilat ankle pain general problem with walking, uses a cane for support and balance has a hard time standing feels like she is falling or going to fall. Some days she just wakes up hurting due to pain. Does get some radiating pain  into the calves due to pain in ankle and knee pain. Has pain when working and or driving for distances of time. Intermittent ibu and tylenol but doesn't use often. No numbness but sometimes rare tingling. She describes the pain as general soreness that sometimes goes to the hips as well    08/24/2023 Patient states she is a little better. HEP has helped with morning pain   09/21/23 Patient states pain wise she is doing ok. Had a bad day yesterday but took the meloxicam  and it got better. Just a little sore today.    10/31/2023 Patient states she is alright has some soreness has been working a lot    11/28/2023 Patient states meloxicam  referral, pcp recommended rheumatology referral. She is a little sore do to increased activity   12/26/2023 Patient states this month she has had more pain flares. Will be going to have marfan testing done   Relevant Historical Information: ADHD  Additional pertinent review of systems negative.   Current Outpatient Medications:    escitalopram (LEXAPRO) 10 MG tablet, Take by mouth., Disp: , Rfl:    levocetirizine (XYZAL) 5 MG tablet, SMARTSIG:1 Tablet(s) By Mouth Every Evening, Disp: , Rfl:    LORYNA 3-0.02  MG tablet, Take 1 tablet by mouth daily., Disp: , Rfl:    meloxicam  (MOBIC ) 15 MG tablet, Take 1 tablet (15 mg total) by mouth as needed for pain., Disp: 30 tablet, Rfl: 0   montelukast (SINGULAIR) 10 MG tablet, Take 10 mg by mouth daily., Disp: , Rfl:    TRI-SPRINTEC 0.18/0.215/0.25 MG-35 MCG tablet, Take 1 tablet by mouth daily., Disp: ,  Rfl:    Objective:     Vitals:   12/26/23 1318  Weight: 129 lb (58.5 kg)  Height: 6' (1.829 m)      Body mass index is 17.5 kg/m.    Physical Exam:    General: Well-appearing, cooperative, sitting comfortably in no acute distress.   OMT Physical Exam:  ASIS Compression Test: Positive Right Cervical: TTP paraspinal, C5 RLSR Rib: Bilateral elevated first rib with TTP Thoracic: TTP paraspinal, T6 RRSR Lumbar: TTP paraspinal, L1-3 RRSL, L5 RL Pelvis: Right anterior innominate    Electronically signed by:  Odis Mace D.[name] 1:42 PM 12/26/23

## 2023-12-26 ENCOUNTER — Ambulatory Visit: Payer: MEDICAID | Admitting: Sports Medicine

## 2023-12-26 VITALS — Ht 72.0 in | Wt 129.0 lb

## 2023-12-26 DIAGNOSIS — M9908 Segmental and somatic dysfunction of rib cage: Secondary | ICD-10-CM

## 2023-12-26 DIAGNOSIS — M9903 Segmental and somatic dysfunction of lumbar region: Secondary | ICD-10-CM | POA: Diagnosis not present

## 2023-12-26 DIAGNOSIS — M546 Pain in thoracic spine: Secondary | ICD-10-CM

## 2023-12-26 DIAGNOSIS — M9901 Segmental and somatic dysfunction of cervical region: Secondary | ICD-10-CM

## 2023-12-26 DIAGNOSIS — M2550 Pain in unspecified joint: Secondary | ICD-10-CM

## 2023-12-26 DIAGNOSIS — M542 Cervicalgia: Secondary | ICD-10-CM | POA: Diagnosis not present

## 2023-12-26 DIAGNOSIS — G8929 Other chronic pain: Secondary | ICD-10-CM

## 2023-12-26 DIAGNOSIS — M9902 Segmental and somatic dysfunction of thoracic region: Secondary | ICD-10-CM

## 2023-12-26 DIAGNOSIS — M357 Hypermobility syndrome: Secondary | ICD-10-CM

## 2023-12-26 DIAGNOSIS — M9905 Segmental and somatic dysfunction of pelvic region: Secondary | ICD-10-CM

## 2023-12-26 NOTE — Patient Instructions (Addendum)
 Printed new PT referral   4 week follow up

## 2023-12-26 NOTE — Addendum Note (Signed)
 Addended by: MAYBELL ALSTON R on: 12/26/2023 01:22 PM   Modules accepted: Orders

## 2024-01-19 NOTE — Progress Notes (Signed)
 Ben Willy Vorce D.[facility]MENT[facility] [address] [place] [address] Phone: [phone]   Assessment and Plan:     1. Hypermobility syndrome (Primary) 2. Polyarthralgia 3. Neck pain 4. Chronic bilateral thoracic back pain 5. Somatic dysfunction of cervical region 6. Somatic dysfunction of thoracic region 7. Somatic dysfunction of lumbar region 8. Somatic dysfunction of rib region 9. Somatic dysfunction of pelvic region -Chronic with exacerbation, subsequent visit - Overall improvement in multiple areas of musculoskeletal pain with mild recurrent flares of pain in neck and upper back.  Consistent with musculoskeletal dysfunction with underlying hypermobility - Prior autoimmune lab work was unremarkable including negative ANA and negative antibody titers - Use meloxicam  15 mg daily as needed for breakthrough pain.  Recommend limiting chronic NSAIDs to 1-2 doses per week to prevent long-term side effects. Use Tylenol 500 to 1000 mg tablets 2-3 times a day as needed for day-to-day pain relief.    -Continue PT and HEP - Patient has received relief with OMT in the past.  Elects for repeat OMT today.  Tolerated well per note below. - Decision today to treat with OMT was based on Physical Exam   After verbal consent patient was treated with HVLA (high velocity low amplitude), ME (muscle energy), FPR (flex positional release), ST (soft tissue), PC/PD (Pelvic Compression/ Pelvic Decompression) techniques in cervical, rib, thoracic, lumbar, and pelvic areas. Patient tolerated the procedure well with improvement in symptoms.  Patient educated on potential side effects of soreness and recommended to rest, hydrate, and use Tylenol as needed for pain control.   Pertinent previous records reviewed include [facility] primary care telephone encounter 9/24's 25  Follow Up: 4 weeks for reevaluation.  Could consider repeat OMT   Subjective:   I, [name], am serving as a Neurosurgeon for  Doctor Morene Mace  Chief Complaint: OMT   HPI:  07/27/2023 Patient is a 18 year old female with joint pain. Patient states ankle and knee soreness. Has had that for years.  Bilat ankle pain general problem with walking, uses a cane for support and balance has a hard time standing feels like she is falling or going to fall. Some days she just wakes up hurting due to pain. Does get some radiating pain  into the calves due to pain in ankle and knee pain. Has pain when working and or driving for distances of time. Intermittent ibu and tylenol but doesn't use often. No numbness but sometimes rare tingling. She describes the pain as general soreness that sometimes goes to the hips as well    08/24/2023 Patient states she is a little better. HEP has helped with morning pain   09/21/23 Patient states pain wise she is doing ok. Had a bad day yesterday but took the meloxicam  and it got better. Just a little sore today.    10/31/2023 Patient states she is alright has some soreness has been working a lot    11/28/2023 Patient states meloxicam  referral, pcp recommended rheumatology referral. She is a little sore do to increased activity    12/26/2023 Patient states this month she has had more pain flares. Will be going to have marfan testing done   01/23/2024 Patient states she is doing alright. PT has been helping    Relevant Historical Information: ADHD  Additional pertinent review of systems negative.  Current Outpatient Medications  Medication Sig Dispense Refill   escitalopram (LEXAPRO) 10 MG tablet Take by mouth.     levocetirizine (XYZAL) 5  MG tablet SMARTSIG:1 Tablet(s) By Mouth Every Evening     LORYNA 3-0.02 MG tablet Take 1 tablet by mouth daily.     meloxicam  (MOBIC ) 15 MG tablet Take 1 tablet (15 mg total) by mouth as needed for pain. 30 tablet 0   montelukast (SINGULAIR) 10 MG tablet Take 10 mg by mouth daily.     TRI-SPRINTEC 0.18/0.215/0.25 MG-35 MCG tablet Take 1 tablet by  mouth daily.     No current facility-administered medications for this visit.      Objective:     Vitals:   01/23/24 1324  BP: 102/80  Pulse: 78  SpO2: 99%  Weight: 134 lb (60.8 kg)  Height: 6' (1.829 m)      Body mass index is 18.17 kg/m.    Physical Exam:     General: Well-appearing, cooperative, sitting comfortably in no acute distress.   OMT Physical Exam:  ASIS Compression Test: Positive Right Cervical: TTP paraspinal, C3 RRSL SR, C5 RL SR Rib: Bilateral elevated first rib with TTP Thoracic: TTP paraspinal, T3-5 RRSL Lumbar: TTP paraspinal, L1-3 RRSL, L5 RL Pelvis: Right anterior innominate with out flare  Electronically signed by:  Odis Mace D.[facility] 2:10 PM 01/23/24

## 2024-01-23 ENCOUNTER — Ambulatory Visit: Payer: MEDICAID | Admitting: Sports Medicine

## 2024-01-23 VITALS — BP 102/80 | HR 78 | Ht 72.0 in | Wt 134.0 lb

## 2024-01-23 DIAGNOSIS — M2550 Pain in unspecified joint: Secondary | ICD-10-CM | POA: Diagnosis not present

## 2024-01-23 DIAGNOSIS — M546 Pain in thoracic spine: Secondary | ICD-10-CM

## 2024-01-23 DIAGNOSIS — M9908 Segmental and somatic dysfunction of rib cage: Secondary | ICD-10-CM

## 2024-01-23 DIAGNOSIS — M542 Cervicalgia: Secondary | ICD-10-CM

## 2024-01-23 DIAGNOSIS — M9905 Segmental and somatic dysfunction of pelvic region: Secondary | ICD-10-CM

## 2024-01-23 DIAGNOSIS — M9901 Segmental and somatic dysfunction of cervical region: Secondary | ICD-10-CM

## 2024-01-23 DIAGNOSIS — M357 Hypermobility syndrome: Secondary | ICD-10-CM

## 2024-01-23 DIAGNOSIS — M9902 Segmental and somatic dysfunction of thoracic region: Secondary | ICD-10-CM

## 2024-01-23 DIAGNOSIS — M9903 Segmental and somatic dysfunction of lumbar region: Secondary | ICD-10-CM

## 2024-01-23 DIAGNOSIS — G8929 Other chronic pain: Secondary | ICD-10-CM

## 2024-02-16 NOTE — Progress Notes (Signed)
 Ben Elga Santy D.[facility] [address] [place] [address] Phone: [phone]   Assessment and Plan:     1. Hypermobility syndrome (Primary) 2. Polyarthralgia 3. Neck pain 4. Chronic bilateral thoracic back pain 5. Somatic dysfunction of cervical region 6. Somatic dysfunction of thoracic region 7. Somatic dysfunction of lumbar region 8. Somatic dysfunction of rib region 9. Somatic dysfunction of pelvic region -Chronic with exacerbation, subsequent visit - Overall improvement in multiple areas of musculoskeletal pain with recurrent pain in neck and bilateral ankles.  Still consistent with musculoskeletal dysfunction related to hypermobility - Prior autoimmune lab work was unremarkable including negative ANA and negative antibody titers - Use meloxicam  15 mg daily as needed for breakthrough pain.  Recommend limiting chronic NSAIDs to 1-2 doses per week to prevent long-term side effects. Use Tylenol 500 to 1000 mg tablets 2-3 times a day as needed for day-to-day pain relief.    - Continue HEP and physical therapy - Patient has received relief with OMT in the past.  Elects for repeat OMT today.  Tolerated well per note below. - Decision today to treat with OMT was based on Physical Exam   After verbal consent patient was treated with HVLA (high velocity low amplitude), ME (muscle energy), FPR (flex positional release), ST (soft tissue), PC/PD (Pelvic Compression/ Pelvic Decompression) techniques in cervical, rib, thoracic, lumbar, and pelvic areas. Patient tolerated the procedure well with improvement in symptoms.  Patient educated on potential side effects of soreness and recommended to rest, hydrate, and use Tylenol as needed for pain control.   Pertinent previous records reviewed include none  Follow Up: 4 weeks for reevaluation.  Could consider repeat OMT   Subjective:   I, [name], am serving as a neurosurgeon for Doctor Morene Mace  Chief  Complaint: OMT   HPI:  07/27/2023 Patient is a 18 year old female with joint pain. Patient states ankle and knee soreness. Has had that for years.  Bilat ankle pain general problem with walking, uses a cane for support and balance has a hard time standing feels like she is falling or going to fall. Some days she just wakes up hurting due to pain. Does get some radiating pain  into the calves due to pain in ankle and knee pain. Has pain when working and or driving for distances of time. Intermittent ibu and tylenol but doesn't use often. No numbness but sometimes rare tingling. She describes the pain as general soreness that sometimes goes to the hips as well    08/24/2023 Patient states she is a little better. HEP has helped with morning pain   09/21/23 Patient states pain wise she is doing ok. Had a bad day yesterday but took the meloxicam  and it got better. Just a little sore today.    10/31/2023 Patient states she is alright has some soreness has been working a lot    11/28/2023 Patient states meloxicam  referral, pcp recommended rheumatology referral. She is a little sore do to increased activity    12/26/2023 Patient states this month she has had more pain flares. Will be going to have marfan testing done    01/23/2024 Patient states she is doing alright. PT has been helping   02/20/2024 Patient states she would like a refill of meloxicam . She has been doing well.     Relevant Historical Information: ADHD  Additional pertinent review of systems negative.  Current Outpatient Medications  Medication Sig Dispense Refill   levocetirizine (XYZAL)  5 MG tablet SMARTSIG:1 Tablet(s) By Mouth Every Evening     meloxicam  (MOBIC ) 15 MG tablet Take 1 tablet (15 mg total) by mouth as needed for pain. 30 tablet 0   montelukast (SINGULAIR) 10 MG tablet Take 10 mg by mouth daily.     TRI-SPRINTEC 0.18/0.215/0.25 MG-35 MCG tablet Take 1 tablet by mouth daily.     No current facility-administered  medications for this visit.      Objective:     Vitals:   02/20/24 1307  BP: 122/80  Pulse: 96  SpO2: 98%  Weight: 136 lb (61.7 kg)  Height: 6' (1.829 m)      Body mass index is 18.44 kg/m.    Physical Exam:     General: Well-appearing, cooperative, sitting comfortably in no acute distress.   OMT Physical Exam:  ASIS Compression Test: Positive Right Cervical: TTP paraspinal, C3 RRSL, C4-7 RLSL Rib: Bilateral elevated first rib with TTP, worse on right Thoracic: TTP paraspinal, T4-6 RRSL Lumbar: TTP paraspinal, L1-3 RRSL Pelvis: Right anterior innominate  Electronically signed by:  Odis Mace D.[facility] 1:32 PM 02/20/24

## 2024-02-20 ENCOUNTER — Ambulatory Visit: Payer: MEDICAID | Admitting: Sports Medicine

## 2024-02-20 VITALS — BP 122/80 | HR 96 | Ht 72.0 in | Wt 136.0 lb

## 2024-02-20 DIAGNOSIS — M2550 Pain in unspecified joint: Secondary | ICD-10-CM

## 2024-02-20 DIAGNOSIS — M9902 Segmental and somatic dysfunction of thoracic region: Secondary | ICD-10-CM

## 2024-02-20 DIAGNOSIS — M9901 Segmental and somatic dysfunction of cervical region: Secondary | ICD-10-CM | POA: Diagnosis not present

## 2024-02-20 DIAGNOSIS — M546 Pain in thoracic spine: Secondary | ICD-10-CM

## 2024-02-20 DIAGNOSIS — M9903 Segmental and somatic dysfunction of lumbar region: Secondary | ICD-10-CM | POA: Diagnosis not present

## 2024-02-20 DIAGNOSIS — G8929 Other chronic pain: Secondary | ICD-10-CM

## 2024-02-20 DIAGNOSIS — M542 Cervicalgia: Secondary | ICD-10-CM

## 2024-02-20 DIAGNOSIS — M9908 Segmental and somatic dysfunction of rib cage: Secondary | ICD-10-CM | POA: Diagnosis not present

## 2024-02-20 DIAGNOSIS — M9905 Segmental and somatic dysfunction of pelvic region: Secondary | ICD-10-CM

## 2024-02-20 DIAGNOSIS — M357 Hypermobility syndrome: Secondary | ICD-10-CM

## 2024-02-20 MED ORDER — MELOXICAM 15 MG PO TABS: 15.0000 mg | ORAL_TABLET | ORAL | 0 refills | Status: AC | PRN

## 2024-03-16 NOTE — Progress Notes (Unsigned)
 [facility] [address] [place] [address] Phone: [phone]   Assessment and Plan:     1. Hypermobility syndrome (Primary) 2. Polyarthralgia 3. Neck pain 4. Chronic bilateral thoracic back pain 5. Somatic dysfunction of cervical region 6. Somatic dysfunction of thoracic region 7. Somatic dysfunction of lumbar region 8. Somatic dysfunction of rib region 9. Somatic dysfunction of pelvic region -Chronic with exacerbation, subsequent visit - Flare of cervicothoracic pain.  Still most consistent with musculoskeletal dysfunction related to hypermobility - Prior autoimmune lab work was unremarkable including negative ANA and negative antibody titers - Use meloxicam  15 mg daily as needed for breakthrough pain.  Recommend limiting chronic NSAIDs to 1-2 doses per week to prevent long-term side effects. Use Tylenol 500 to 1000 mg tablets 2-3 times a day as needed for day-to-day pain relief.    - Patient's insurance is no longer covering physical therapy.  Continue HEP - Patient has received relief with OMT in the past.  Elects for repeat OMT today.  Tolerated well per note below. - Decision today to treat with OMT was based on Physical Exam   After verbal consent patient was treated with HVLA (high velocity low amplitude), ME (muscle energy), FPR (flex positional release), ST (soft tissue), PC/PD (Pelvic Compression/ Pelvic Decompression) techniques in cervical, rib, thoracic, lumbar, and pelvic areas. Patient tolerated the procedure well with improvement in symptoms.  Patient educated on potential side effects of soreness and recommended to rest, hydrate, and use Tylenol as needed for pain control.   Pertinent previous records reviewed include none  Follow Up: 4 to 6 weeks for reevaluation.  Could consider repeat OMT   Subjective:   I, Erica Nelson, am serving as a neurosurgeon for Erica Nelson  Chief Complaint: OMT   HPI:   07/27/2023 Patient is a 18 year old female with joint pain. Patient states ankle and knee soreness. Has had that for years.  Bilat ankle pain general problem with walking, uses a cane for support and balance has a hard time standing feels like she is falling or going to fall. Some days she just wakes up hurting due to pain. Does get some radiating pain  into the calves due to pain in ankle and knee pain. Has pain when working and or driving for distances of time. Intermittent ibu and tylenol but doesn't use often. No numbness but sometimes rare tingling. She describes the pain as general soreness that sometimes goes to the hips as well    08/24/2023 Patient states she is a little better. HEP has helped with morning pain   09/21/23 Patient states pain wise she is doing ok. Had a bad day yesterday but took the meloxicam  and it got better. Just a little sore today.    10/31/2023 Patient states she is alright has some soreness has been working a lot    11/28/2023 Patient states meloxicam  referral, pcp recommended rheumatology referral. She is a little sore do to increased activity    12/26/2023 Patient states this month she has had more pain flares. Will be going to have marfan testing done    01/23/2024 Patient states she is doing alright. PT has been helping    02/20/2024 Patient states she would like a refill of meloxicam . She has been doing well.    03/19/2024 Patient states she is okay. Insurance isnt able to cover PT anymore    Relevant Historical Information: ADHD    Additional pertinent review of  systems negative.  Current Outpatient Medications  Medication Sig Dispense Refill   levocetirizine (XYZAL) 5 MG tablet SMARTSIG:1 Tablet(s) By Mouth Every Evening     meloxicam  (MOBIC ) 15 MG tablet Take 1 tablet (15 mg total) by mouth as needed for pain. 30 tablet 0   montelukast (SINGULAIR) 10 MG tablet Take 10 mg by mouth daily.     TRI-SPRINTEC 0.18/0.215/0.25 MG-35 MCG tablet Take 1  tablet by mouth daily.     No current facility-administered medications for this visit.      Objective:     Vitals:   03/19/24 1252  BP: 120/84  Pulse: 81  SpO2: 98%  Weight: 135 lb (61.2 kg)  Height: 6' (1.829 m)      Body mass index is 18.31 kg/m.    Physical Exam:     General: Well-appearing, cooperative, sitting comfortably in no acute distress.   OMT Physical Exam:  ASIS Compression Test: Positive Right Cervical: TTP paraspinal, C3 RRSL Rib: Bilateral elevated first rib with TTP Thoracic: TTP paraspinal, T2 RRSR Lumbar: TTP paraspinal, L1-3 RRSL Pelvis: Right anterior innominate  Electronically signed by:  [facility] 1:10 PM 03/19/2024

## 2024-03-19 ENCOUNTER — Ambulatory Visit: Payer: MEDICAID | Admitting: Sports Medicine

## 2024-03-19 VITALS — BP 120/84 | HR 81 | Ht 72.0 in | Wt 135.0 lb

## 2024-03-19 DIAGNOSIS — M546 Pain in thoracic spine: Secondary | ICD-10-CM | POA: Diagnosis not present

## 2024-03-19 DIAGNOSIS — M9903 Segmental and somatic dysfunction of lumbar region: Secondary | ICD-10-CM

## 2024-03-19 DIAGNOSIS — M542 Cervicalgia: Secondary | ICD-10-CM

## 2024-03-19 DIAGNOSIS — M9902 Segmental and somatic dysfunction of thoracic region: Secondary | ICD-10-CM

## 2024-03-19 DIAGNOSIS — M9901 Segmental and somatic dysfunction of cervical region: Secondary | ICD-10-CM

## 2024-03-19 DIAGNOSIS — M9908 Segmental and somatic dysfunction of rib cage: Secondary | ICD-10-CM

## 2024-03-19 DIAGNOSIS — M2550 Pain in unspecified joint: Secondary | ICD-10-CM

## 2024-03-19 DIAGNOSIS — G8929 Other chronic pain: Secondary | ICD-10-CM | POA: Diagnosis not present

## 2024-03-19 DIAGNOSIS — M9905 Segmental and somatic dysfunction of pelvic region: Secondary | ICD-10-CM

## 2024-03-19 DIAGNOSIS — M357 Hypermobility syndrome: Secondary | ICD-10-CM | POA: Diagnosis not present

## 2024-04-13 NOTE — Progress Notes (Unsigned)
 "  Erica Nelson [facility] [address] [place] [address] Phone: [phone]   Assessment and Plan:     1. Hypermobility syndrome (Primary) 2. Polyarthralgia 3. Neck pain 4. Chronic bilateral thoracic back pain 5. Somatic dysfunction of cervical region 6. Somatic dysfunction of thoracic region 7. Somatic dysfunction of lumbar region 8. Somatic dysfunction of rib region 9. Somatic dysfunction of pelvic region -Chronic with exacerbation, subsequent visit -Recurrent flare of cervicothoracic pain.  Still most consistent with musculoskeletal dysfunction related to hypermobility - Use meloxicam  15 mg daily as needed for breakthrough pain.  Recommend limiting chronic NSAIDs to 1-2 doses per week to prevent long-term side effects. Use Tylenol 500 to 1000 mg tablets 2-3 times a day as needed for day-to-day pain relief.    - Patient has received relief with OMT in the past.  Elects for repeat OMT today.  Tolerated well per note below. - Decision today to treat with OMT was based on Physical Exam   After verbal consent patient was treated with HVLA (high velocity low amplitude), ME (muscle energy), FPR (flex positional release), ST (soft tissue), PC/PD (Pelvic Compression/ Pelvic Decompression) techniques in cervical, rib, thoracic, lumbar, and pelvic areas. Patient tolerated the procedure well with improvement in symptoms.  Patient educated on potential side effects of soreness and recommended to rest, hydrate, and use Tylenol as needed for pain control.   Pertinent previous records reviewed include none  Follow Up: 4 to 6-week follow-up.  Could consider repeat OMT   Subjective:   I, Erica Nelson, am serving as a neurosurgeon for Doctor Morene Mace  Chief Complaint: OMT   HPI:  07/27/2023 Patient is a 19 year old female with joint pain. Patient states ankle and knee soreness. Has had that for years.  Bilat ankle pain general problem with walking, uses a cane  for support and balance has a hard time standing feels like she is falling or going to fall. Some days she just wakes up hurting due to pain. Does get some radiating pain  into the calves due to pain in ankle and knee pain. Has pain when working and or driving for distances of time. Intermittent ibu and tylenol but doesn't use often. No numbness but sometimes rare tingling. She describes the pain as general soreness that sometimes goes to the hips as well    08/24/2023 Patient states she is a little better. HEP has helped with morning pain   09/21/23 Patient states pain wise she is doing ok. Had a bad day yesterday but took the meloxicam  and it got better. Just a little sore today.    10/31/2023 Patient states she is alright has some soreness has been working a lot    11/28/2023 Patient states meloxicam  referral, pcp recommended rheumatology referral. She is a little sore do to increased activity    12/26/2023 Patient states this month she has had more pain flares. Will be going to have marfan testing done    01/23/2024 Patient states she is doing alright. PT has been helping    02/20/2024 Patient states she would like a refill of meloxicam . She has been doing well.    03/19/2024 Patient states she is okay. Insurance isnt able to cover PT anymore   04/20/2024 Patient states she is okay   Relevant Historical Information: ADHD  Additional pertinent review of systems negative.  Current Outpatient Medications  Medication Sig Dispense Refill   levocetirizine (XYZAL) 5 MG tablet SMARTSIG:1 Tablet(s) By Mouth  Every Evening     meloxicam  (MOBIC ) 15 MG tablet Take 1 tablet (15 mg total) by mouth as needed for pain. 30 tablet 0   montelukast (SINGULAIR) 10 MG tablet Take 10 mg by mouth daily.     TRI-SPRINTEC 0.18/0.215/0.25 MG-35 MCG tablet Take 1 tablet by mouth daily.     No current facility-administered medications for this visit.      Objective:     Vitals:   04/16/24 1345  BP:  120/82  Pulse: 74  SpO2: 98%  Weight: 131 lb (59.4 kg)  Height: 6' (1.829 m)      Body mass index is 17.77 kg/m.    Physical Exam:     General: Well-appearing, cooperative, sitting comfortably in no acute distress.   OMT Physical Exam:  ASIS Compression Test: Positive Right Cervical: TTP paraspinal, C3 RRSL, C4-6 RLSL Rib: Bilateral elevated first rib with TTP Thoracic: TTP paraspinal, T6 RRSR Lumbar: TTP paraspinal, L1-3 RRSL, L5 RL Pelvis: Right anterior innominate  Electronically signed by:  Erica Nelson [facility] 1:58 PM 04/16/2024 "

## 2024-04-16 ENCOUNTER — Ambulatory Visit: Payer: MEDICAID | Admitting: Sports Medicine

## 2024-04-16 VITALS — BP 120/82 | HR 74 | Ht 72.0 in | Wt 131.0 lb

## 2024-04-16 DIAGNOSIS — M9908 Segmental and somatic dysfunction of rib cage: Secondary | ICD-10-CM | POA: Diagnosis not present

## 2024-04-16 DIAGNOSIS — M9901 Segmental and somatic dysfunction of cervical region: Secondary | ICD-10-CM

## 2024-04-16 DIAGNOSIS — M9902 Segmental and somatic dysfunction of thoracic region: Secondary | ICD-10-CM | POA: Diagnosis not present

## 2024-04-16 DIAGNOSIS — G8929 Other chronic pain: Secondary | ICD-10-CM | POA: Diagnosis not present

## 2024-04-16 DIAGNOSIS — M542 Cervicalgia: Secondary | ICD-10-CM

## 2024-04-16 DIAGNOSIS — M357 Hypermobility syndrome: Secondary | ICD-10-CM

## 2024-04-16 DIAGNOSIS — M546 Pain in thoracic spine: Secondary | ICD-10-CM

## 2024-04-16 DIAGNOSIS — M9903 Segmental and somatic dysfunction of lumbar region: Secondary | ICD-10-CM

## 2024-04-16 DIAGNOSIS — M2550 Pain in unspecified joint: Secondary | ICD-10-CM

## 2024-04-16 DIAGNOSIS — M9905 Segmental and somatic dysfunction of pelvic region: Secondary | ICD-10-CM

## 2024-05-11 NOTE — Progress Notes (Unsigned)
 "  Odis Mace D.[facility]MENTE[facility] [address] [place] [address] Phone: [phone]   Assessment and Plan:     *** - Patient has received relief with OMT in the past.  Elects for repeat OMT today.  Tolerated well per note below. - Decision today to treat with OMT was based on Physical Exam   After verbal consent patient was treated with HVLA (high velocity low amplitude), ME (muscle energy), FPR (flex positional release), ST (soft tissue), PC/PD (Pelvic Compression/ Pelvic Decompression) techniques in cervical, rib, thoracic, lumbar, and pelvic areas. Patient tolerated the procedure well with improvement in symptoms.  Patient educated on potential side effects of soreness and recommended to rest, hydrate, and use Tylenol as needed for pain control.   Pertinent previous records reviewed include ***    Follow Up: ***     Subjective:   I, Masaru Chamberlin, am serving as a neurosurgeon for Doctor Morene Mace  Chief Complaint: OMT   HPI:  07/27/2023 Patient is a 19 year old female with joint pain. Patient states ankle and knee soreness. Has had that for years.  Bilat ankle pain general problem with walking, uses a cane for support and balance has a hard time standing feels like she is falling or going to fall. Some days she just wakes up hurting due to pain. Does get some radiating pain  into the calves due to pain in ankle and knee pain. Has pain when working and or driving for distances of time. Intermittent ibu and tylenol but doesn't use often. No numbness but sometimes rare tingling. She describes the pain as general soreness that sometimes goes to the hips as well    08/24/2023 Patient states she is a little better. HEP has helped with morning pain   09/21/23 Patient states pain wise she is doing ok. Had a bad day yesterday but took the meloxicam  and it got better. Just a little sore today.    10/31/2023 Patient states she is alright has some soreness has been  working a lot    11/28/2023 Patient states meloxicam  referral, pcp recommended rheumatology referral. She is a little sore do to increased activity    12/26/2023 Patient states this month she has had more pain flares. Will be going to have marfan testing done    01/23/2024 Patient states she is doing alright. PT has been helping    02/20/2024 Patient states she would like a refill of meloxicam . She has been doing well.    03/19/2024 Patient states she is okay. Insurance isnt able to cover PT anymore    04/20/2024 Patient states she is okay  05/14/2024 Patient states   Relevant Historical Information: ADHD  Additional pertinent review of systems negative.  Current Outpatient Medications  Medication Sig Dispense Refill   levocetirizine (XYZAL) 5 MG tablet SMARTSIG:1 Tablet(s) By Mouth Every Evening     meloxicam  (MOBIC ) 15 MG tablet Take 1 tablet (15 mg total) by mouth as needed for pain. 30 tablet 0   montelukast (SINGULAIR) 10 MG tablet Take 10 mg by mouth daily.     TRI-SPRINTEC 0.18/0.215/0.25 MG-35 MCG tablet Take 1 tablet by mouth daily.     No current facility-administered medications for this visit.      Objective:     There were no vitals filed for this visit.    There is no height or weight on file to calculate BMI.    Physical Exam:     General: Well-appearing, cooperative,  sitting comfortably in no acute distress.   OMT Physical Exam:  ASIS Compression Test: Positive Right Cervical: TTP paraspinal, *** Rib: Bilateral elevated first rib with TTP Thoracic: TTP paraspinal,*** Lumbar: TTP paraspinal,*** Pelvis: Right anterior innominate  Electronically signed by:  Odis Mace D.[facility] 7:20 AM 05/11/24 "

## 2024-05-14 ENCOUNTER — Ambulatory Visit: Payer: MEDICAID | Admitting: Sports Medicine
